# Patient Record
Sex: Female | Born: 1945 | Race: White | Hispanic: No
Health system: Southern US, Community
[De-identification: ages and names within clinical notes are randomized; demographics above are authoritative.]

## PROBLEM LIST (undated history)

## (undated) DIAGNOSIS — J189 Pneumonia, unspecified organism: Secondary | ICD-10-CM

## (undated) DIAGNOSIS — I10 Essential (primary) hypertension: Secondary | ICD-10-CM

## (undated) DIAGNOSIS — K219 Gastro-esophageal reflux disease without esophagitis: Secondary | ICD-10-CM

---

## 2014-10-28 ENCOUNTER — Other Ambulatory Visit: Payer: Self-pay

## 2014-10-28 ENCOUNTER — Emergency Department: Payer: Commercial Managed Care - HMO

## 2014-10-28 ENCOUNTER — Encounter: Payer: Self-pay | Admitting: Emergency Medicine

## 2014-10-28 ENCOUNTER — Emergency Department
Admission: EM | Admit: 2014-10-28 | Discharge: 2014-10-29 | Payer: Commercial Managed Care - HMO | Attending: Emergency Medicine | Admitting: Emergency Medicine

## 2014-10-28 DIAGNOSIS — R0902 Hypoxemia: Secondary | ICD-10-CM | POA: Diagnosis not present

## 2014-10-28 DIAGNOSIS — I10 Essential (primary) hypertension: Secondary | ICD-10-CM | POA: Diagnosis not present

## 2014-10-28 DIAGNOSIS — R0602 Shortness of breath: Secondary | ICD-10-CM | POA: Insufficient documentation

## 2014-10-28 DIAGNOSIS — J810 Acute pulmonary edema: Secondary | ICD-10-CM | POA: Diagnosis not present

## 2014-10-28 DIAGNOSIS — I209 Angina pectoris, unspecified: Secondary | ICD-10-CM | POA: Diagnosis not present

## 2014-10-28 DIAGNOSIS — R0603 Acute respiratory distress: Secondary | ICD-10-CM

## 2014-10-28 DIAGNOSIS — R079 Chest pain, unspecified: Secondary | ICD-10-CM | POA: Diagnosis present

## 2014-10-28 DIAGNOSIS — I249 Acute ischemic heart disease, unspecified: Secondary | ICD-10-CM

## 2014-10-28 HISTORY — DX: Essential (primary) hypertension: I10

## 2014-10-28 HISTORY — DX: Pneumonia, unspecified organism: J18.9

## 2014-10-28 HISTORY — DX: Gastro-esophageal reflux disease without esophagitis: K21.9

## 2014-10-28 LAB — BLOOD GAS, ARTERIAL
ALLENS TEST (PASS/FAIL): POSITIVE — AB
Acid-base deficit: 6.2 mmol/L — ABNORMAL HIGH (ref 0.0–2.0)
Bicarbonate: 20.7 mEq/L — ABNORMAL LOW (ref 21.0–28.0)
Delivery systems: POSITIVE
EXPIRATORY PAP: 5
FIO2: 0.8
Inspiratory PAP: 16
Mechanical Rate: 8
O2 Saturation: 91.8 %
PH ART: 7.27 — AB (ref 7.350–7.450)
Patient temperature: 37
pCO2 arterial: 45 mmHg (ref 32.0–48.0)
pO2, Arterial: 72 mmHg — ABNORMAL LOW (ref 83.0–108.0)

## 2014-10-28 LAB — COMPREHENSIVE METABOLIC PANEL
ALBUMIN: 4.4 g/dL (ref 3.5–5.0)
ALT: 21 U/L (ref 14–54)
ANION GAP: 9 (ref 5–15)
AST: 36 U/L (ref 15–41)
Alkaline Phosphatase: 80 U/L (ref 38–126)
BUN: 10 mg/dL (ref 6–20)
CO2: 26 mmol/L (ref 22–32)
Calcium: 9.7 mg/dL (ref 8.9–10.3)
Chloride: 105 mmol/L (ref 101–111)
Creatinine, Ser: 0.99 mg/dL (ref 0.44–1.00)
GFR calc Af Amer: >60 mL/min (ref >60)
GFR calc non Af Amer: 57 mL/min — ABNORMAL LOW (ref >60)
Glucose, Bld: 126 mg/dL — ABNORMAL HIGH (ref 65–99)
Potassium: 3.4 mmol/L — ABNORMAL LOW (ref 3.5–5.1)
Sodium: 140 mmol/L (ref 135–145)
TOTAL PROTEIN: 8.4 g/dL — AB (ref 6.5–8.1)
Total Bilirubin: 0.9 mg/dL (ref 0.3–1.2)

## 2014-10-28 LAB — CBC
HCT: 47.5 % — ABNORMAL HIGH (ref 35.0–47.0)
Hemoglobin: 15.6 g/dL (ref 12.0–16.0)
MCH: 31.1 pg (ref 26.0–34.0)
MCHC: 32.9 g/dL (ref 32.0–36.0)
MCV: 94.6 fL (ref 80.0–100.0)
PLATELETS: 283 10*3/uL (ref 150–440)
RBC: 5.03 MIL/uL (ref 3.80–5.20)
RDW: 15 % — AB (ref 11.5–14.5)
WBC: 13.5 10*3/uL — AB (ref 3.6–11.0)

## 2014-10-28 LAB — LACTIC ACID, PLASMA: Lactic Acid, Venous: 1.7 mmol/L (ref 0.5–2.0)

## 2014-10-28 LAB — TROPONIN I: Troponin I: 0.04 ng/mL — ABNORMAL HIGH (ref <0.031)

## 2014-10-28 LAB — BRAIN NATRIURETIC PEPTIDE: B Natriuretic Peptide: 576 pg/mL — ABNORMAL HIGH (ref 0.0–100.0)

## 2014-10-28 MED ORDER — ASPIRIN 81 MG PO CHEW
CHEWABLE_TABLET | ORAL | Status: AC
  Administered 2014-10-28: 324 mg via ORAL
  Filled 2014-10-28: qty 1

## 2014-10-28 MED ORDER — ASPIRIN 81 MG PO CHEW
324.0000 mg | CHEWABLE_TABLET | Freq: Once | ORAL | Status: AC
  Administered 2014-10-28: 324 mg via ORAL

## 2014-10-28 MED ORDER — ONDANSETRON HCL 4 MG/2ML IJ SOLN
4.0000 mg | Freq: Once | INTRAMUSCULAR | Status: AC
Start: 2014-10-28 — End: 2014-10-28
  Administered 2014-10-28: 4 mg via INTRAVENOUS

## 2014-10-28 MED ORDER — NITROGLYCERIN IN D5W 200-5 MCG/ML-% IV SOLN
INTRAVENOUS | Status: AC
  Filled 2014-10-28: qty 250

## 2014-10-28 MED ORDER — NITROGLYCERIN 0.4 MG SL SUBL
0.4000 mg | SUBLINGUAL_TABLET | SUBLINGUAL | Status: DC | PRN
Start: 2014-10-28 — End: 2014-10-29
  Administered 2014-10-28 (×3): 0.4 mg via SUBLINGUAL

## 2014-10-28 MED ORDER — ONDANSETRON HCL 4 MG/2ML IJ SOLN
4.0000 mg | Freq: Once | INTRAMUSCULAR | Status: AC
  Administered 2014-10-28: 4 mg via INTRAVENOUS

## 2014-10-28 MED ORDER — MORPHINE SULFATE 4 MG/ML IJ SOLN
INTRAMUSCULAR | Status: AC
  Administered 2014-10-28: 4 mg via INTRAVENOUS
  Filled 2014-10-28: qty 1

## 2014-10-28 MED ORDER — ONDANSETRON HCL 4 MG/2ML IJ SOLN
INTRAMUSCULAR | Status: AC
Start: 2014-10-28 — End: 2014-10-28
  Administered 2014-10-28: 4 mg via INTRAVENOUS
  Filled 2014-10-28: qty 2

## 2014-10-28 MED ORDER — HEPARIN SODIUM (PORCINE) 5000 UNIT/ML IJ SOLN
5000.0000 [IU] | Freq: Once | INTRAMUSCULAR | Status: AC
  Administered 2014-10-28: 5000 [IU] via INTRAVENOUS
  Filled 2014-10-28: qty 1

## 2014-10-28 MED ORDER — FUROSEMIDE 10 MG/ML IJ SOLN
40.0000 mg | Freq: Once | INTRAMUSCULAR | Status: AC
  Administered 2014-10-28: 40 mg via INTRAVENOUS

## 2014-10-28 MED ORDER — TICAGRELOR 90 MG PO TABS
180.0000 mg | ORAL_TABLET | Freq: Once | ORAL | Status: AC
  Administered 2014-10-28: 180 mg via ORAL
  Filled 2014-10-28 (×2): qty 2

## 2014-10-28 MED ORDER — MORPHINE SULFATE 4 MG/ML IJ SOLN
4.0000 mg | Freq: Once | INTRAMUSCULAR | Status: AC
  Administered 2014-10-28: 4 mg via INTRAVENOUS

## 2014-10-28 NOTE — ED Notes (Signed)
Patient presents with c/o SOB x 2 weeks; worsened tonight. (+) CP and cough reported.

## 2014-10-28 NOTE — ED Provider Notes (Signed)
[facility] Emergency Department Provider Note  ____________________________________________  Time seen: Approximately 11:21 PM  I have reviewed the triage vital signs and the nursing notes.   HISTORY  Chief Complaint Chest Pain and Shortness of Breath    HPI Yvette Wheeler is a 69 y.o. female who presents to the ED from home with a chief complaint of chest pain and shortness of breath. Patient reports only a past medical history of hypertension, GERD, and pneumonia 5 years ago. Denies history of CAD, CHF, asthma or COPD. She had been in her usual state of health and worked outside in her yard for approximately 2-3 hours today when she began to experience left-sided chest pain, now ongoing 4 hours. Subsequently she began to have difficulty breathing which progressed over the past 3 hours. Denies fever, chills, abdominal pain, nausea, vomiting, diarrhea. Nothing makes the pain or breathing better or worse.   Past Medical History  Diagnosis Date  . Pneumonia   . HTN (hypertension)   . GERD (gastroesophageal reflux disease)     There are no active problems to display for this patient.   History reviewed. No pertinent past surgical history.  Current Outpatient Rx  Name  Route  Sig  Dispense  Refill  . gabapentin (NEURONTIN) 600 MG tablet   Oral   Take 600 mg by mouth 4 (four) times daily as needed.         Also reports taking Amlodipine and Omeprazole  Allergies Review of patient's allergies indicates no known allergies.  History reviewed. No pertinent family history.  Social History Social History  Substance Use Topics  . Smoking status: None  . Smokeless tobacco: None  . Alcohol Use: None  Nonsmoker  Review of Systems Constitutional: No fever/chills Eyes: No visual changes. ENT: No sore throat. Cardiovascular: Positive for chest pain. Respiratory: Positive for shortness of breath. Gastrointestinal: No abdominal pain.  No nausea, no  vomiting.  No diarrhea.  No constipation. Genitourinary: Negative for dysuria. Musculoskeletal: Negative for back pain. Skin: Negative for rash. Neurological: Negative for headaches, focal weakness or numbness.  10-point ROS otherwise negative.  ____________________________________________   PHYSICAL EXAM:  VITAL SIGNS: ED Triage Vitals  Enc Vitals Group     BP 10/28/14 2254 148/105 mmHg     Pulse Rate 10/28/14 2254 128     Resp 10/28/14 2254 32     Temp 10/28/14 2254 98.2 F (36.8 C)     Temp Source 10/28/14 2254 Oral     SpO2 10/28/14 2254 85 %     Weight 10/28/14 2254 153 lb 6.4 oz (69.582 kg)     Height --      Head Cir --      Peak Flow --      Pain Score 10/28/14 2255 10     Pain Loc --      Pain Edu? --      Excl. in GC? --     Constitutional: Alert and oriented. Ill-appearing and in severe acute distress. Eyes: Conjunctivae are normal. PERRL. EOMI. Head: Atraumatic. Nose: No congestion/rhinnorhea. Mouth/Throat: Mucous membranes are moist.  Oropharynx non-erythematous. Neck: No stridor.   Cardiovascular: Tachycardic, regular rhythm. Grossly normal heart sounds.  Good peripheral circulation. Respiratory: Severely labored respiratory effort. Rales diffusely. Some wheezing noted. Gastrointestinal: Soft and nontender. No distention. No abdominal bruits. No CVA tenderness. Musculoskeletal: No lower extremity tenderness nor edema.  No joint effusions. Neurologic:  Normal speech and language. No gross focal neurologic deficits are appreciated. No  gait instability. Skin:  Skin is extremely diaphoretic and intact. No rash noted. Psychiatric: Mood and affect are normal. Speech and behavior are normal.  ____________________________________________   LABS (all labs ordered are listed, but only abnormal results are displayed)  Labs Reviewed  CBC - Abnormal; Notable for the following:    WBC 13.5 (*)    HCT 47.5 (*)    RDW 15.0 (*)    All other components within  normal limits  TROPONIN I - Abnormal; Notable for the following:    Troponin I 0.04 (*)    All other components within normal limits  COMPREHENSIVE METABOLIC PANEL - Abnormal; Notable for the following:    Potassium 3.4 (*)    Glucose, Bld 126 (*)    Total Protein 8.4 (*)    GFR calc non Af Amer 57 (*)    All other components within normal limits  BRAIN NATRIURETIC PEPTIDE - Abnormal; Notable for the following:    B Natriuretic Peptide 576.0 (*)    All other components within normal limits  BLOOD GAS, ARTERIAL - Abnormal; Notable for the following:    pH, Arterial 7.27 (*)    pO2, Arterial 72 (*)    Bicarbonate 20.7 (*)    Acid-base deficit 6.2 (*)    Allens test (pass/fail) POSITIVE (*)    All other components within normal limits  LACTIC ACID, PLASMA   ____________________________________________  EKG  ED ECG REPORT I, Mylani Gentry J, the attending physician, personally viewed and interpreted this ECG.   Date: 10/28/2014  EKG Time: 2258  Rate: 121  Rhythm: there are no previous tracings available for comparison, sinus tachycardia, LBBB  Axis: LAD  Intervals:left bundle branch block  ST&T Change: Nonspecific ST-T abnormalities  ____________________________________________  RADIOLOGY  Portable chest x-ray (viewed by me, interpreted per Dr. Cherly Hensen): Vascular congestion and mild cardiomegaly, with increased interstitial markings, concerning for mild interstitial edema. ____________________________________________   PROCEDURES  Procedure(s) performed: None  Critical Care performed: Yes, see critical care note(s)   CRITICAL CARE Performed by: Irean Hong   Total critical care time: 60 minutes  Critical care time was exclusive of separately billable procedures and treating other patients.  Critical care was necessary to treat or prevent imminent or life-threatening deterioration.  Critical care was time spent personally by me on the following activities:  development of treatment plan with patient and/or surrogate as well as nursing, discussions with consultants, evaluation of patient's response to treatment, examination of patient, obtaining history from patient or surrogate, ordering and performing treatments and interventions, ordering and review of laboratory studies, ordering and review of radiographic studies, pulse oximetry and re-evaluation of patient's condition.   ____________________________________________   INITIAL IMPRESSION / ASSESSMENT AND PLAN / ED COURSE  Pertinent labs & imaging results that were available during my care of the patient were reviewed by me and considered in my medical decision making (see chart for details).  69 year old female with no prior cardiac or respiratory history who presents with left-sided chest pain ongoing 4 hours, now in severe respiratory distress consistent with flash pulmonary edema. [facility] transfer center contacted to speak with the CCU fellow for possible transfer for emergent cardiac catheterization.  ----------------------------------------- 11:22 PM on 10/28/2014 -----------------------------------------  Repeat EKGs faxed to Dr. Stan Head [facility] CCU fellow). Patient placed on BiPAP and within 10 minutes is are ready feeling better with improving clinical appearance. Heart rate currently 125, blood pressure 154/97. Saturations 96% on BiPAP. One sublingual nitroglycerin has decreased her chest pain from 10-5/10.  Will add aspirin, morphine, Lasix, continue nitroglycerin. Wet read of portable chest x-ray consistent with overt pulmonary edema.  ----------------------------------------- 11:29 PM on 10/28/2014 -----------------------------------------  Spoke again with Dr. Stan Head who has reviewed the EKGs; will most likely activate cath lab after discussion with her attending.  ----------------------------------------- 11:38 PM on 10/28/2014 -----------------------------------------  Patient  has been accepted for transfer to [facility]. She will proceed to the emergency department then directly to the Cath Lab. Patient updated  ----------------------------------------- 11:51 PM on 10/28/2014 -----------------------------------------  Patient's chest pain returning, now 6/10. She has had all 3 sublingual nitroglycerins. Will repeat IV morphine and prepare a nitroglycerin drip. Spoke again with Dr. Stan Head; will give heparin 5000 unit bolus as well as Brilinta 180 mg. [facility] in route to transport.  ----------------------------------------- 12:22 AM on 10/29/2014 -----------------------------------------  [facility] at bedside preparing patient for transport. Updated Dr. Stan Head of laboratory results. ____________________________________________   FINAL CLINICAL IMPRESSION(S) / ED DIAGNOSES  Final diagnoses:  Ischemic chest pain  Acute pulmonary edema  Respiratory distress  Hypoxia  Acute coronary syndrome      Irean Hong, MD 10/29/14 [phone]

## 2014-10-29 MED ORDER — NITROGLYCERIN IN D5W 200-5 MCG/ML-% IV SOLN: 0.0000 ug/min | Freq: Once | INTRAVENOUS | Status: DC

## 2014-10-29 NOTE — ED Notes (Signed)
Report called to [facility] ED.  

## 2014-10-29 NOTE — ED Notes (Signed)
Per dr. Dolores Frame, hold nitro drip due to pt bp.

## 2014-11-23 ENCOUNTER — Encounter: Admission: RE | Payer: Self-pay | Source: Ambulatory Visit

## 2014-11-23 ENCOUNTER — Ambulatory Visit
Admission: RE | Admit: 2014-11-23 | Payer: Commercial Managed Care - HMO | Source: Ambulatory Visit | Admitting: Ophthalmology

## 2014-11-23 SURGERY — PHACOEMULSIFICATION, CATARACT, WITH IOL INSERTION
Anesthesia: Choice | Laterality: Right

## 2016-08-28 IMAGING — CR DG CHEST 1V PORT
1 series · 1 of 1 positions shown · non-contrast
Comparison: None.

CLINICAL DATA: Subacute onset of shortness of breath. Generalized
chest pain and cough. Initial encounter.

EXAM:
PORTABLE CHEST - 1 VIEW

[portable]
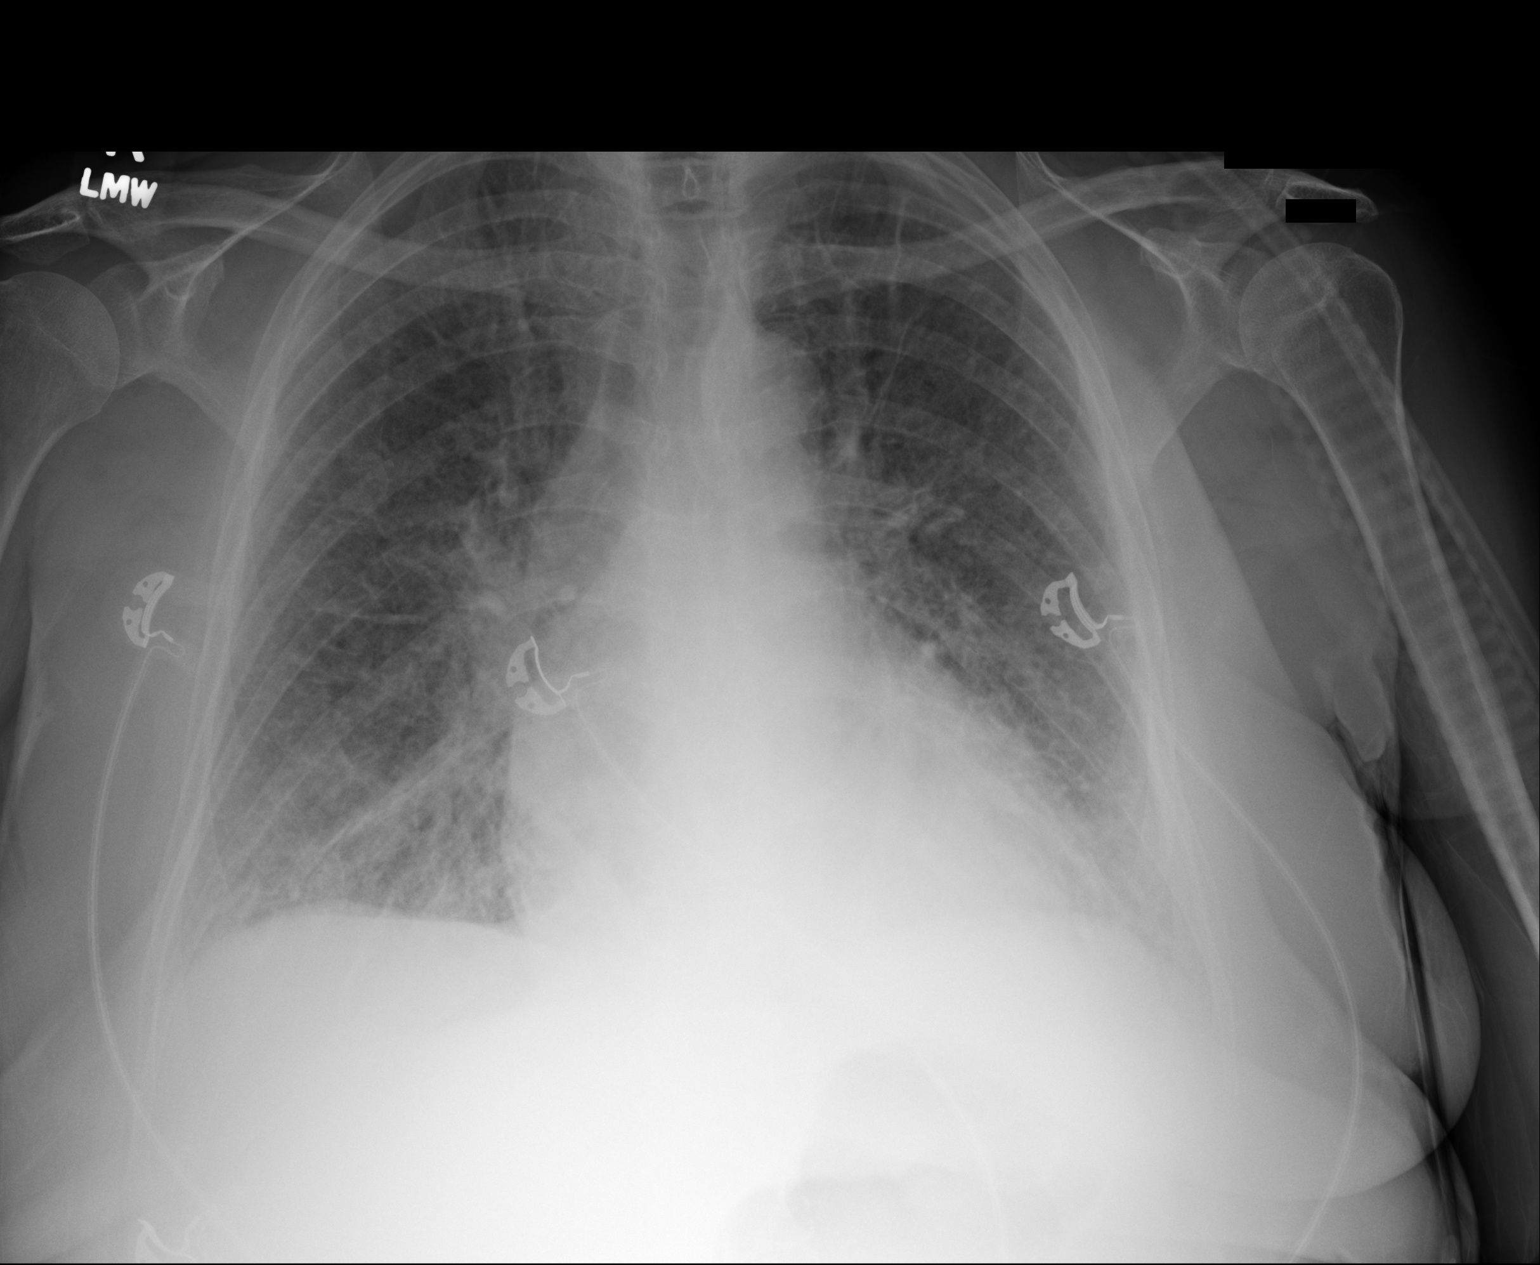

[1 of 1 positions shown; findings below may reference images not displayed]

FINDINGS: The lungs are well-aerated. Vascular congestion is noted, with
increased interstitial markings, concerning for mild interstitial
edema. No definite pleural effusion or pneumothorax is seen.

The cardiomediastinal silhouette is mildly enlarged. No acute
osseous abnormalities are seen.
IMPRESSION: Vascular congestion and mild cardiomegaly, with increased
interstitial markings, concerning for mild interstitial edema.
